# Patient Record
Sex: Male | Born: 1970 | Race: Black or African American | Hispanic: No | Marital: Married | Smoking: Never smoker
Health system: Southern US, Community
[De-identification: ages and names within clinical notes are randomized; demographics above are authoritative.]

---

## 2017-05-24 ENCOUNTER — Ambulatory Visit: Payer: BLUE CROSS/BLUE SHIELD | Admitting: Urology

## 2017-05-24 DIAGNOSIS — N3000 Acute cystitis without hematuria: Secondary | ICD-10-CM | POA: Diagnosis not present

## 2017-09-06 ENCOUNTER — Ambulatory Visit: Payer: BLUE CROSS/BLUE SHIELD | Admitting: Urology

## 2017-09-06 DIAGNOSIS — N3000 Acute cystitis without hematuria: Secondary | ICD-10-CM

## 2019-02-04 ENCOUNTER — Emergency Department (HOSPITAL_COMMUNITY)
Admission: EM | Admit: 2019-02-04 | Discharge: 2019-02-04 | Disposition: A | Discharge status: Home / Self Care | Payer: Self-pay | Attending: Emergency Medicine | Admitting: Emergency Medicine

## 2019-02-04 ENCOUNTER — Emergency Department (HOSPITAL_COMMUNITY): Payer: Self-pay

## 2019-02-04 ENCOUNTER — Encounter (HOSPITAL_COMMUNITY): Payer: Self-pay | Admitting: *Deleted

## 2019-02-04 ENCOUNTER — Other Ambulatory Visit: Payer: Self-pay

## 2019-02-04 DIAGNOSIS — M25561 Pain in right knee: Secondary | ICD-10-CM | POA: Insufficient documentation

## 2019-02-04 MED ORDER — NAPROXEN 500 MG PO TABS: 500.0000 mg | ORAL_TABLET | Freq: Two times a day (BID) | ORAL | 0 refills | Status: AC

## 2019-02-04 NOTE — ED Provider Notes (Signed)
[facility] EMERGENCY DEPARTMENT Provider Note   CSN: [id number] Arrival date & time: 02/04/19  1416     History Chief Complaint  Patient presents with  . Knee Pain    Samuel Gibbs is a 49 y.o. male with no significant past medical history presents today for evaluation of acute onset, persistent right knee pain for 3 months.  Reports he experiences the pain daily primarily when he is laying down to try to sleep and with certain positions.  The pain is throbbing, localized to the medial aspect of the right knee, does not radiate.  It worsens with flexion of the right knee as well but no pain with ambulation/weightbearing.  Denies numbness or weakness of the extremity.  No fevers or joint swelling.  He has not tried anything for his symptoms.  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  The history is provided by the patient.       History reviewed. No pertinent past medical history.  There are no problems to display for this patient.   History reviewed. No pertinent surgical history.     No family history on file.  Social History   Tobacco Use  . Smoking status: Never Smoker  . Smokeless tobacco: Never Used  Substance Use Topics  . Alcohol use: Never  . Drug use: Never    Home Medications Prior to Admission medications   Medication Sig Start Date End Date Taking? Authorizing Provider  naproxen (NAPROSYN) 500 MG tablet Take 1 tablet (500 mg total) by mouth 2 (two) times daily with a meal. 02/04/19   Renay Crammer A, PA-C    Allergies    Penicillins  Review of Systems   Review of Systems  Constitutional: Negative for chills and fever.  Musculoskeletal: Positive for arthralgias. Negative for joint swelling.  Neurological: Negative for weakness and numbness.    Physical Exam Updated Vital Signs BP 134/87 (BP Location: Right Arm)   Pulse 68   Temp (!) 97.5 F (36.4 C) (Oral)   Resp 16   SpO2 100%   Physical Exam Vitals and  nursing note reviewed.  Constitutional:      General: He is not in acute distress.    Appearance: He is well-developed.  HENT:     Head: Normocephalic and atraumatic.  Eyes:     General:        Right eye: No discharge.        Left eye: No discharge.     Conjunctiva/sclera: Conjunctivae normal.  Neck:     Vascular: No JVD.     Trachea: No tracheal deviation.  Cardiovascular:     Rate and Rhythm: Normal rate.     Pulses: Normal pulses.     Comments: 2+ DP/PT pulses bilaterally, Homans sign absent bilaterally, no lower extremity edema, no palpable cords, compartments are soft  Pulmonary:     Effort: Pulmonary effort is normal.  Abdominal:     General: There is no distension.  Musculoskeletal:        General: Tenderness present. Normal range of motion.     Comments: Tenderness to palpation along the right MCL.  No varus or valgus instability, no ligamentous laxity.  No erythema or warmth.  No swelling or induration noted.  Negative anterior/posterior drawer test.  5/5 strength of BLE major muscle groups.  Skin:    General: Skin is warm and dry.     Capillary Refill: Capillary refill takes less than 2 seconds.  Findings: No erythema.  Neurological:     Mental Status: He is alert.     Comments: Fluent speech, no facial droop, sensation intact to light touch of bilateral lower extremities, normal gait, and patient able to heel walk and toe walk without difficulty.   Psychiatric:        Behavior: Behavior normal.     ED Results / Procedures / Treatments   Labs (all labs ordered are listed, but only abnormal results are displayed) Labs Reviewed - No data to display  EKG None  Radiology DG Knee Complete 4 Views Right  Result Date: 02/04/2019 CLINICAL DATA:  Medial right knee pain EXAM: RIGHT KNEE - COMPLETE 4+ VIEW COMPARISON:  None. FINDINGS: Alignment is anatomic. A joint effusion is present. Mild tricompartmental changes of osteoarthritis. IMPRESSION: Mild  osteoarthritis.  Joint effusion. Electronically Signed   By: Guadlupe Spanish M.D.   On: 02/04/2019 15:07    Procedures Procedures (including critical care time)  Medications Ordered in ED Medications - No data to display  ED Course  I have reviewed the triage vital signs and the nursing notes.  Pertinent labs & imaging results that were available during my care of the patient were reviewed by me and considered in my medical decision making (see chart for details).    MDM Rules/Calculators/A&P                      Patient presenting with complaint of right knee pain for 3 months.  No history of known trauma.  He is afebrile, vital signs are stable.  He is nontoxic in appearance.  He is neurovascularly intact.  No signs of secondary skin infection, DVT, septic arthritis, gout, or osteomyelitis.  Radiographs show mild osteoarthritis with joint effusion.  Conservative therapy indicated and discussed with patient.  Will discharge with course of naproxen, knee sleeve, discussed utility of ice, gentle stretching.  He will follow up with PCP or orthopedist for reevaluation of symptoms.  Discussed strict ED return precautions. Patient verbalized understanding of and agreement with plan and is safe for discharge home at this time.  Final Clinical Impression(s) / ED Diagnoses Final diagnoses:  Acute pain of right knee    Rx / DC Orders ED Discharge Orders         Ordered    naproxen (NAPROSYN) 500 MG tablet  2 times daily with meals     02/04/19 1559           Jeanie Sewer, PA-C 02/04/19 1610    Vanetta Mulders, MD 02/04/19 1913

## 2019-02-04 NOTE — Discharge Instructions (Signed)
1. Medications: Take naproxen twice daily with meals.  Do not take ibuprofen, Advil, Aleve or Motrin while taking this medication.  You can however take 1 to 2 tablets of Tylenol every 6 hours as needed for pain additionally as well.  Do not exceed more than 4000 mg of Tylenol daily. 2. Treatment: rest, apply ice when not walking on extremity, elevate and use knee sleeve, drink plenty of fluids, gentle stretching (see attached exercises or you can YouTube search MCL physical therapy exercises) 3. Follow Up: Please followup with orthopedics as directed or your PCP in 1 week if no improvement for discussion of your diagnoses and further evaluation after today's visit; if you do not have a primary care doctor use the resource guide provided to find one; Please return to the ER for worsening symptoms or other concerns such as worsening swelling, redness of the skin, fevers, loss of pulses, or loss of feeling

## 2019-02-04 NOTE — ED Triage Notes (Signed)
Right knee pain for 3 months, no known injury

## 2019-02-04 NOTE — ED Notes (Signed)
Patient reports R knee pain x3 months. States that he has had no injury to joint. Patient is ambulatory with no complaint of pain. Patient reports pain only at rest, specifically at night when asleep. Patient has no redness, swelling, or warmth noted to knee. NAD noted.

## 2019-02-21 ENCOUNTER — Ambulatory Visit: Payer: Self-pay | Admitting: Orthopaedic Surgery

## 2019-02-21 ENCOUNTER — Encounter: Payer: Self-pay | Admitting: Orthopaedic Surgery

## 2020-06-05 IMAGING — DX DG KNEE COMPLETE 4+V*R*
4 series · 4 of 4 positions shown · non-contrast
Comparison: None.

CLINICAL DATA: Medial right knee pain

EXAM:
RIGHT KNEE - COMPLETE 4+ VIEW

[knee ap]
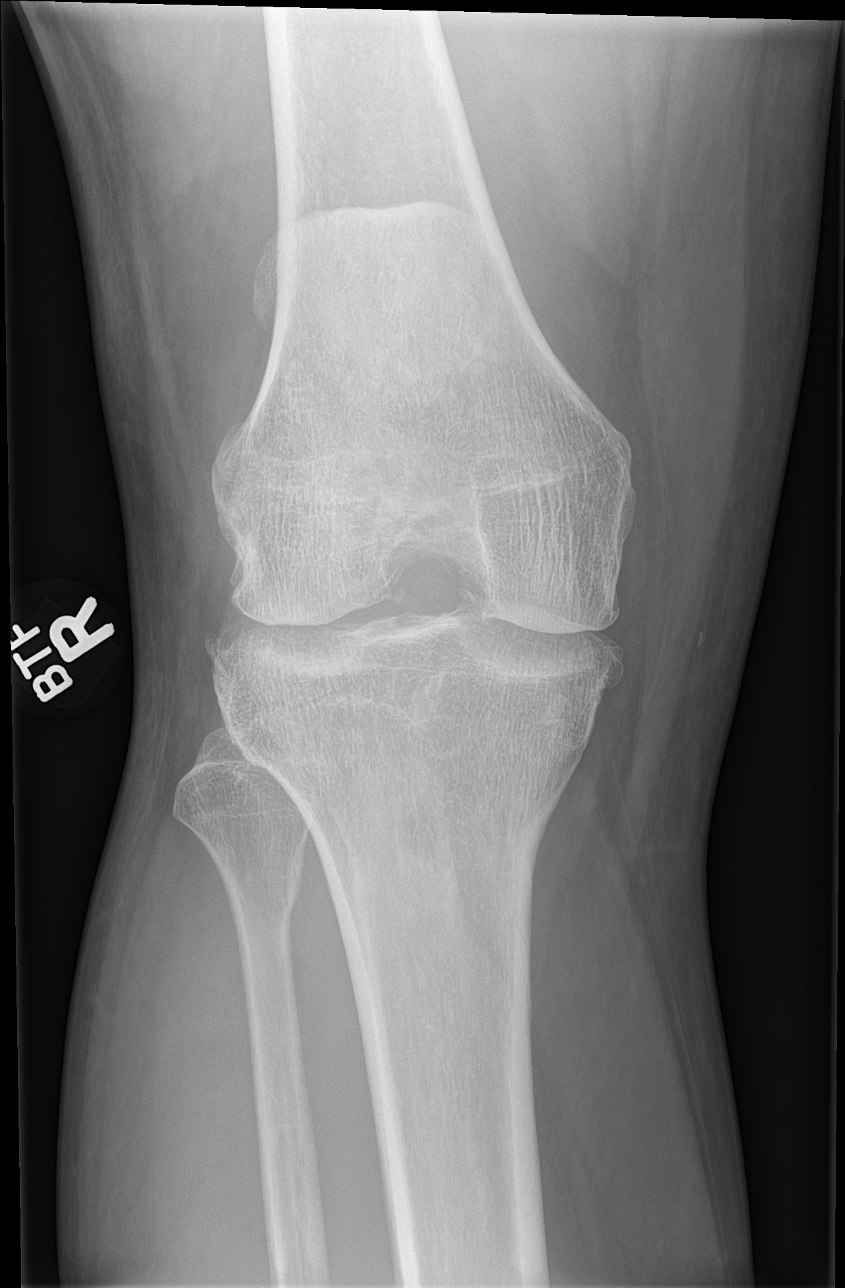

[knee lat]
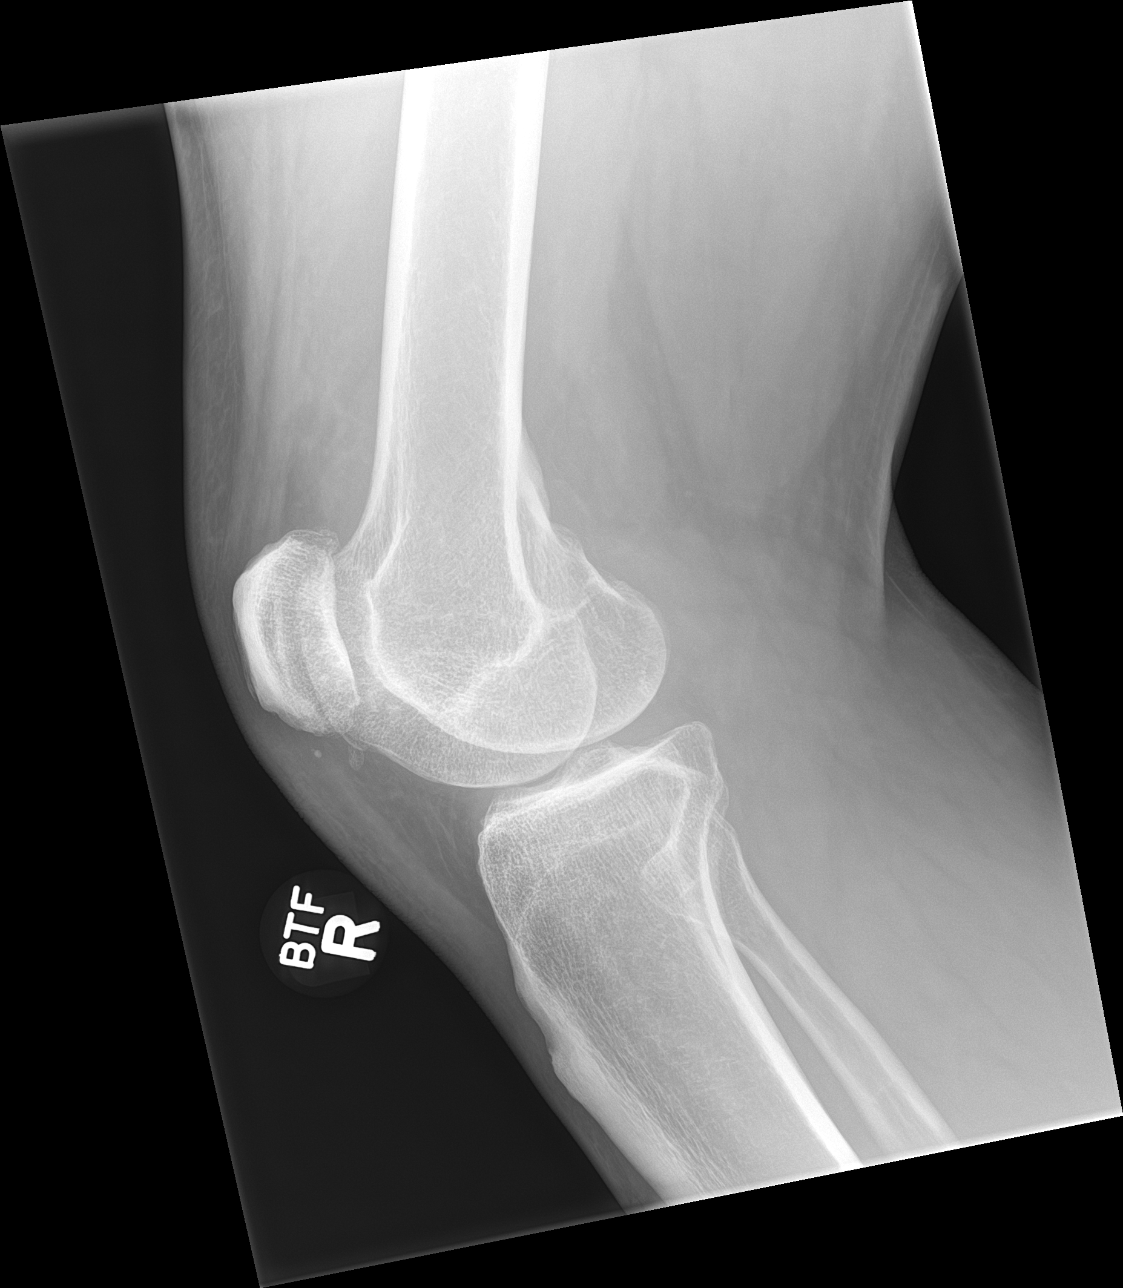

[knee obl (1 of 2)]
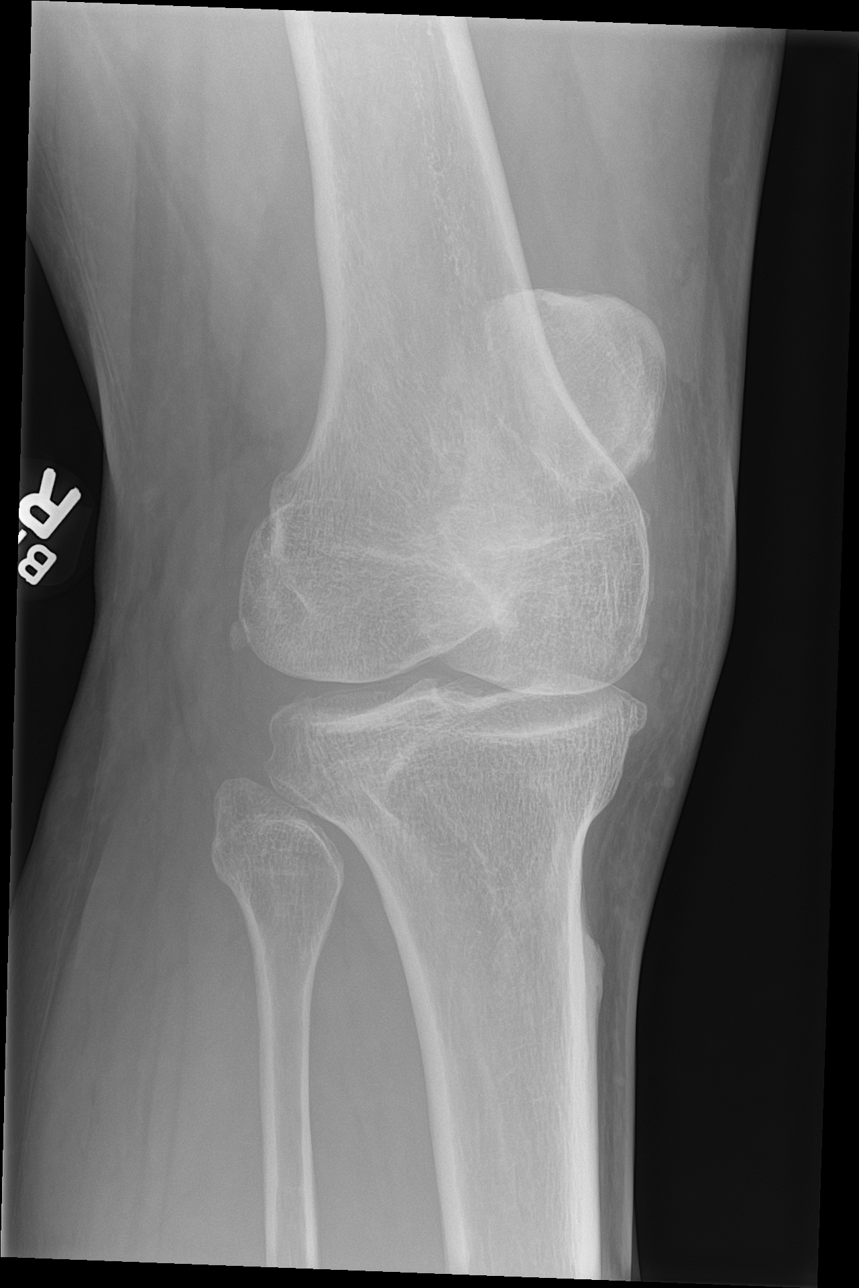

[knee obl (2 of 2)]
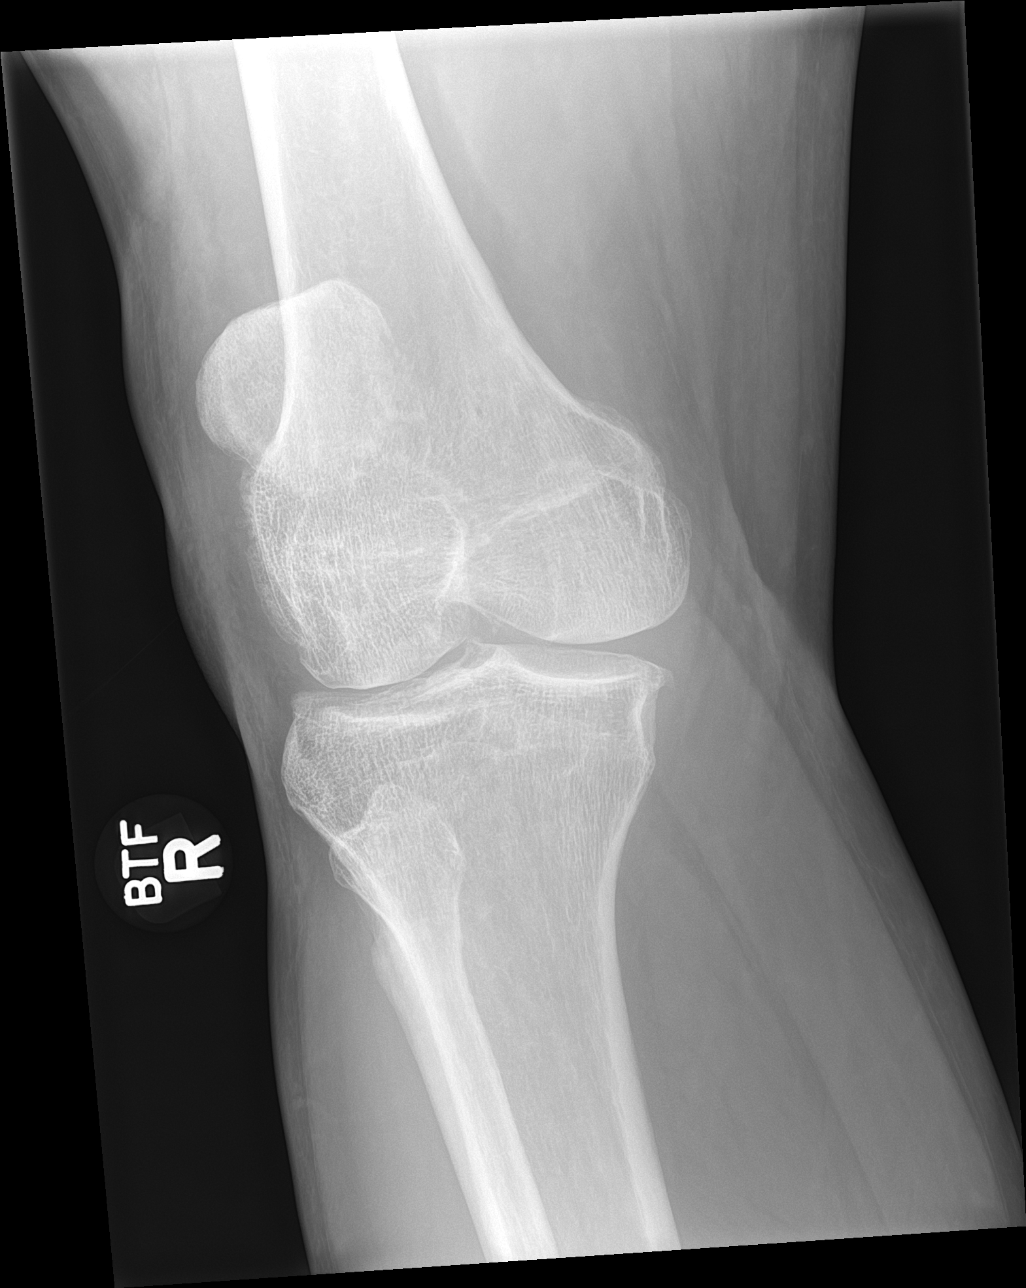

[4 of 4 positions shown; findings below may reference images not displayed]

FINDINGS: Alignment is anatomic. A joint effusion is present. Mild
tricompartmental changes of osteoarthritis.
IMPRESSION: Mild osteoarthritis.  Joint effusion.
